# Patient Record
Sex: Female | Born: 2000 | Race: White | Hispanic: No | Marital: Single | Smoking: Never smoker
Health system: Southern US, Community
[De-identification: ages and names within clinical notes are randomized; demographics above are authoritative.]

---

## 2019-04-12 ENCOUNTER — Encounter: Payer: Self-pay | Admitting: Emergency Medicine

## 2019-04-12 ENCOUNTER — Other Ambulatory Visit: Payer: Self-pay

## 2019-04-12 DIAGNOSIS — Z5321 Procedure and treatment not carried out due to patient leaving prior to being seen by health care provider: Secondary | ICD-10-CM | POA: Diagnosis not present

## 2019-04-12 DIAGNOSIS — J029 Acute pharyngitis, unspecified: Secondary | ICD-10-CM | POA: Diagnosis not present

## 2019-04-12 LAB — GROUP A STREP BY PCR: Group A Strep by PCR: NOT DETECTED

## 2019-04-12 NOTE — ED Triage Notes (Signed)
Pt arrives ambulatory to triage with c/o sore throat since Thursday. Pt states that she was tested for strep and Covid with both tests negative on Friday. Pt is in NAD.

## 2019-04-13 ENCOUNTER — Emergency Department
Admission: EM | Admit: 2019-04-13 | Discharge: 2019-04-13 | Disposition: A | Discharge status: Home / Self Care | Payer: Managed Care, Other (non HMO) | Attending: Emergency Medicine | Admitting: Emergency Medicine

## 2019-04-13 NOTE — ED Notes (Signed)
Pt to stat desk stating that she feels like her throat is closing up. Pt's O2 checked by this RN and O2 is steady at 100% RA. Pt is in NAD.

## 2020-05-18 ENCOUNTER — Other Ambulatory Visit: Payer: Self-pay | Admitting: Family Medicine

## 2020-05-18 ENCOUNTER — Other Ambulatory Visit: Payer: Self-pay

## 2020-05-18 ENCOUNTER — Ambulatory Visit
Admission: RE | Admit: 2020-05-18 | Discharge: 2020-05-18 | Disposition: A | Discharge status: Home / Self Care | Payer: Managed Care, Other (non HMO) | Source: Ambulatory Visit | Attending: Family Medicine | Admitting: Family Medicine

## 2020-05-18 ENCOUNTER — Ambulatory Visit
Admission: RE | Admit: 2020-05-18 | Discharge: 2020-05-18 | Disposition: A | Discharge status: Home / Self Care | Payer: Managed Care, Other (non HMO) | Attending: Family Medicine | Admitting: Family Medicine

## 2020-05-18 DIAGNOSIS — R079 Chest pain, unspecified: Secondary | ICD-10-CM | POA: Diagnosis present

## 2020-05-18 DIAGNOSIS — R0602 Shortness of breath: Secondary | ICD-10-CM

## 2021-11-30 ENCOUNTER — Ambulatory Visit (INDEPENDENT_AMBULATORY_CARE_PROVIDER_SITE_OTHER): Payer: 59 | Admitting: Adult Health

## 2021-11-30 ENCOUNTER — Encounter: Payer: Self-pay | Admitting: Adult Health

## 2021-11-30 VITALS — BP 94/60 | HR 74 | Temp 96.3°F | Ht 67.0 in | Wt 138.0 lb

## 2021-11-30 DIAGNOSIS — L600 Ingrowing nail: Secondary | ICD-10-CM | POA: Diagnosis not present

## 2021-11-30 DIAGNOSIS — Z7184 Encounter for health counseling related to travel: Secondary | ICD-10-CM | POA: Diagnosis not present

## 2021-11-30 DIAGNOSIS — Z23 Encounter for immunization: Secondary | ICD-10-CM

## 2021-11-30 MED ORDER — ACETAZOLAMIDE 125 MG PO TABS: 125.0000 mg | ORAL_TABLET | Freq: Two times a day (BID) | ORAL | 0 refills | Status: AC

## 2021-11-30 NOTE — Progress Notes (Signed)
[facility] [place], [place] [address] Phone: [phone] Fax: [phone]   Office Visit Note  Patient Name: Jessica Gentry  Date of [id number]  Med Rec number [id number]  Date of Service: 11/30/2021  Patient has no known allergies.  Chief Complaint  Patient presents with   Needs RX   Ingrown toe nail    Left Great Toe     HPI Patient is here because she is going to [place] for 10 days and needs Diamox for altitude. She denies any other needs at this time. She is unsure if her vaccines are up to date.   Patient also has left great toe that she has been having ingrown toenail.  She has been trying to keep it lifted.  She has been soaking in epsom salt as well.  Just wants it looked at.   Also needs flu shot.   Current Medication:  No outpatient encounter medications on file as of 11/30/2021.   No facility-administered encounter medications on file as of 11/30/2021.      Medical History: History reviewed. No pertinent past medical history.   Vital Signs: BP 94/60   Pulse 74   Temp (!) 96.3 F (35.7 C) (Tympanic)   Ht 5\' 7"  (1.702 m)   Wt 138 lb (62.6 kg)   SpO2 98%   BMI 21.61 kg/m    Review of Systems  Constitutional:  Negative for chills, fatigue and fever.  Skin:        Left great toenail, short, appears mildly ingrown    Physical Exam Vitals and nursing note reviewed.  Constitutional:      Appearance: Normal appearance.  Skin:    Comments: Left great toe: Mildly ingrown lateral side of nail.  Neurological:     Mental Status: She is alert.    Assessment/Plan: 1. Encounter for counseling for travel Take diamox as discussed. Start on arrival in [place], and take until descending.  Obtain vaccine records, and message me to evaluate for any needs.  - acetaZOLAMIDE (DIAMOX) 125 MG tablet; Take 1 tablet (125 mg total) by mouth 2 (two) times daily.  Dispense: 20 tablet; Refill: 0  2. Ingrown toenail Continue to soak  with epsom salt.  No infection noted.  Discussed slowly lifting nail.  3. Flu vaccine need Flu vaccine given - Flu Vaccine QUAD High Dose(Fluad)        General Counseling: Jessica Gentry verbalizes understanding of the findings of todays visit and agrees with plan of treatment. I have discussed any further diagnostic evaluation that may be needed or ordered today. We also reviewed her medications today. she has been encouraged to call the office with any questions or concerns that should arise related to todays visit.   No orders of the defined types were placed in this encounter.   No orders of the defined types were placed in this encounter.   Time spent:20 Minutes Time spent includes review of chart, medications, test results, and follow up plan with the patient.    Kendell Bane AGNP-C Nurse Practitioner

## 2021-12-12 ENCOUNTER — Ambulatory Visit (INDEPENDENT_AMBULATORY_CARE_PROVIDER_SITE_OTHER): Payer: 59 | Admitting: Adult Health

## 2021-12-12 ENCOUNTER — Encounter: Payer: Self-pay | Admitting: Adult Health

## 2021-12-12 ENCOUNTER — Other Ambulatory Visit: Payer: Self-pay | Admitting: Adult Health

## 2021-12-12 VITALS — HR 108 | Temp 96.4°F

## 2021-12-12 DIAGNOSIS — Z23 Encounter for immunization: Secondary | ICD-10-CM

## 2021-12-12 DIAGNOSIS — Z7184 Encounter for health counseling related to travel: Secondary | ICD-10-CM | POA: Diagnosis not present

## 2021-12-12 DIAGNOSIS — Z2839 Other underimmunization status: Secondary | ICD-10-CM | POA: Diagnosis not present

## 2021-12-12 MED ORDER — TYPHOID VI POLYSACCHARIDE VACC 25 MCG/0.5ML IM SOSY: 0.5000 mL | PREFILLED_SYRINGE | Freq: Once | INTRAMUSCULAR | 0 refills | Status: AC

## 2021-12-12 MED ORDER — TYPHOID VI POLYSACCHARIDE VACC 25 MCG/0.5ML IM SOSY: 0.5000 mL | PREFILLED_SYRINGE | Freq: Once | INTRAMUSCULAR | 0 refills | Status: DC

## 2021-12-12 NOTE — Progress Notes (Signed)
[facility] [address] [place], [place] [address] Phone: [phone] Fax: [phone]   Office Visit Note  Patient Name: Jessica Gentry  Date of Birth:04-May-2000  Med Rec number [id number]  Date of Service: 12/12/2021  Patient has no known allergies.  Chief Complaint  Patient presents with   Vaccine needed     HPI  Patient is here for update on tetanus shot, as well as requesting typhoid vaccine for travel to [place].  She is leaving in 5 days.  We discussed effectiveness of vaccine given minimal time before travel.    Current Medication:  Outpatient Encounter Medications as of 12/12/2021  Medication Sig   typhoid polysaccharide Vacc (TYPHIM VI) 25 MCG/0.5ML injection Inject 0.5 mLs into the muscle once for 1 dose.   [DISCONTINUED] typhoid polysaccharide Vacc (TYPHIM VI) 25 MCG/0.5ML injection Inject 0.5 mLs into the muscle once for 1 dose.   acetaZOLAMIDE (DIAMOX) 125 MG tablet Take 1 tablet (125 mg total) by mouth 2 (two) times daily.   No facility-administered encounter medications on file as of 12/12/2021.      Medical History: History reviewed. No pertinent past medical history.   Vital Signs: Pulse (!) 108   Temp (!) 96.4 F (35.8 C) (Tympanic)   SpO2 98%    Review of Systems  Constitutional:  Negative for fatigue and fever.    Physical Exam Constitutional:      Appearance: Normal appearance.  Neurological:     Mental Status: She is alert.    Assessment/Plan: 1. Underimmunization status Given tetanus in office today.  - Tdap vaccine greater than or equal to 7yo IM  2. Travel advice encounter Up to date on childhood vaccines.  Tetanus updated today.  RX for Typhim sent to pharmacy.  - typhoid polysaccharide Vacc (TYPHIM VI) 25 MCG/0.5ML injection; Inject 0.5 mLs into the muscle once for 1 dose.  Dispense: 0.5 mL; Refill: 0        General Counseling: Makisha verbalizes understanding of the findings of todays visit and agrees with  plan of treatment. I have discussed any further diagnostic evaluation that may be needed or ordered today. We also reviewed her medications today. she has been encouraged to call the office with any questions or concerns that should arise related to todays visit.   Orders Placed This Encounter  Procedures   Tdap vaccine greater than or equal to 7yo IM    Meds ordered this encounter  Medications   DISCONTD: typhoid polysaccharide Vacc (TYPHIM VI) 25 MCG/0.5ML injection    Sig: Inject 0.5 mLs into the muscle once for 1 dose.    Dispense:  0.5 mL    Refill:  0   typhoid polysaccharide Vacc (TYPHIM VI) 25 MCG/0.5ML injection    Sig: Inject 0.5 mLs into the muscle once for 1 dose.    Dispense:  0.5 mL    Refill:  0    Time spent:15 Minutes Time spent includes review of chart, medications, test results, and follow up plan with the patient.    Johnna Acosta AGNP-C Nurse Practitioner

## 2022-02-17 ENCOUNTER — Ambulatory Visit: Payer: Self-pay

## 2022-02-17 ENCOUNTER — Ambulatory Visit: Admit: 2022-02-17 | Discharge status: Against Medical Advice | Payer: Self-pay

## 2022-08-11 IMAGING — CR DG CHEST 2V
1 series · 2 of 2 positions shown · non-contrast
Comparison: None.

CLINICAL DATA: Shortness of breath and chest pain for 3 weeks

EXAM:
CHEST - 2 VIEW

[Series 1: dg chest 2 view · 0.14mm/px · 2 of 2 slices shown]
[im 1/2]
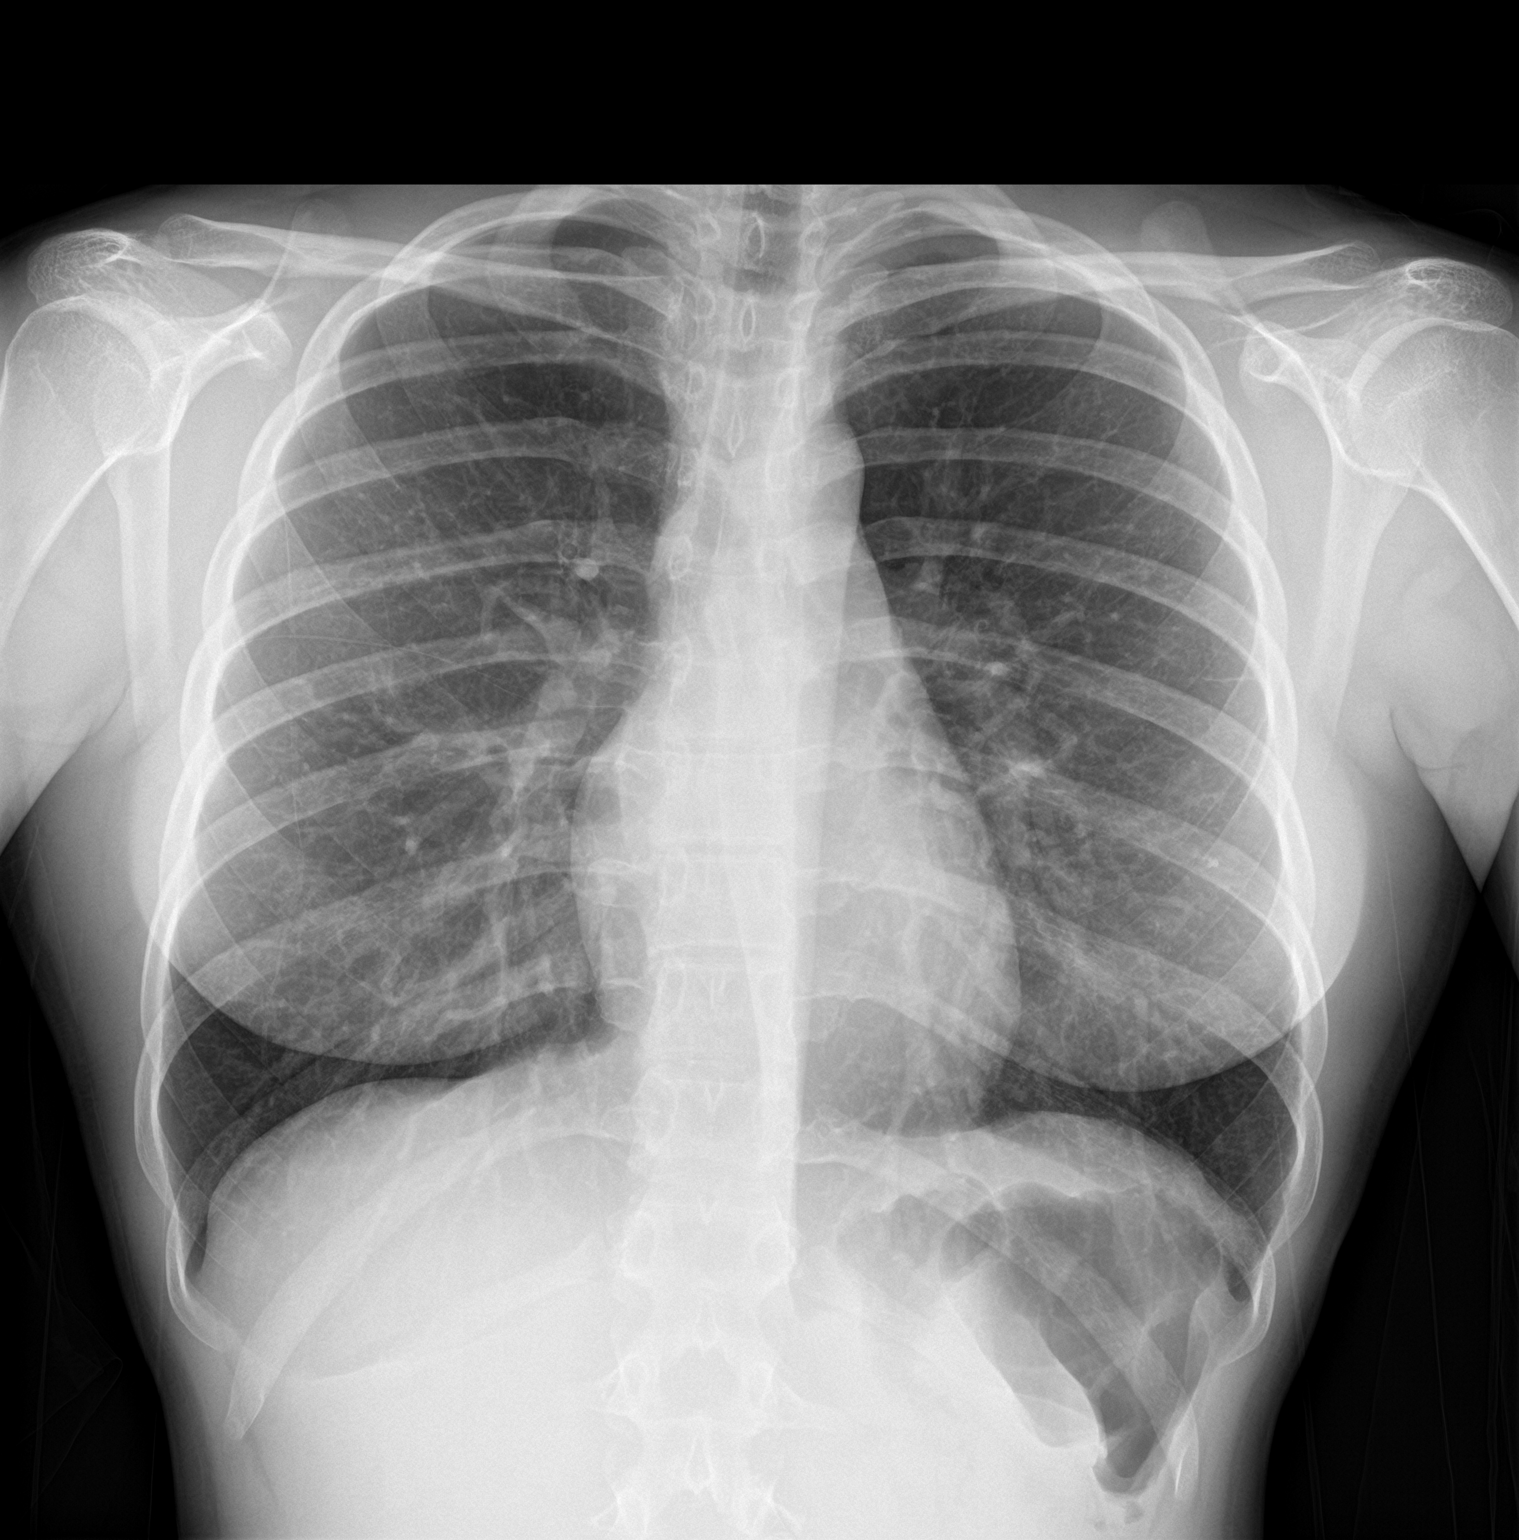
[im 2/2]
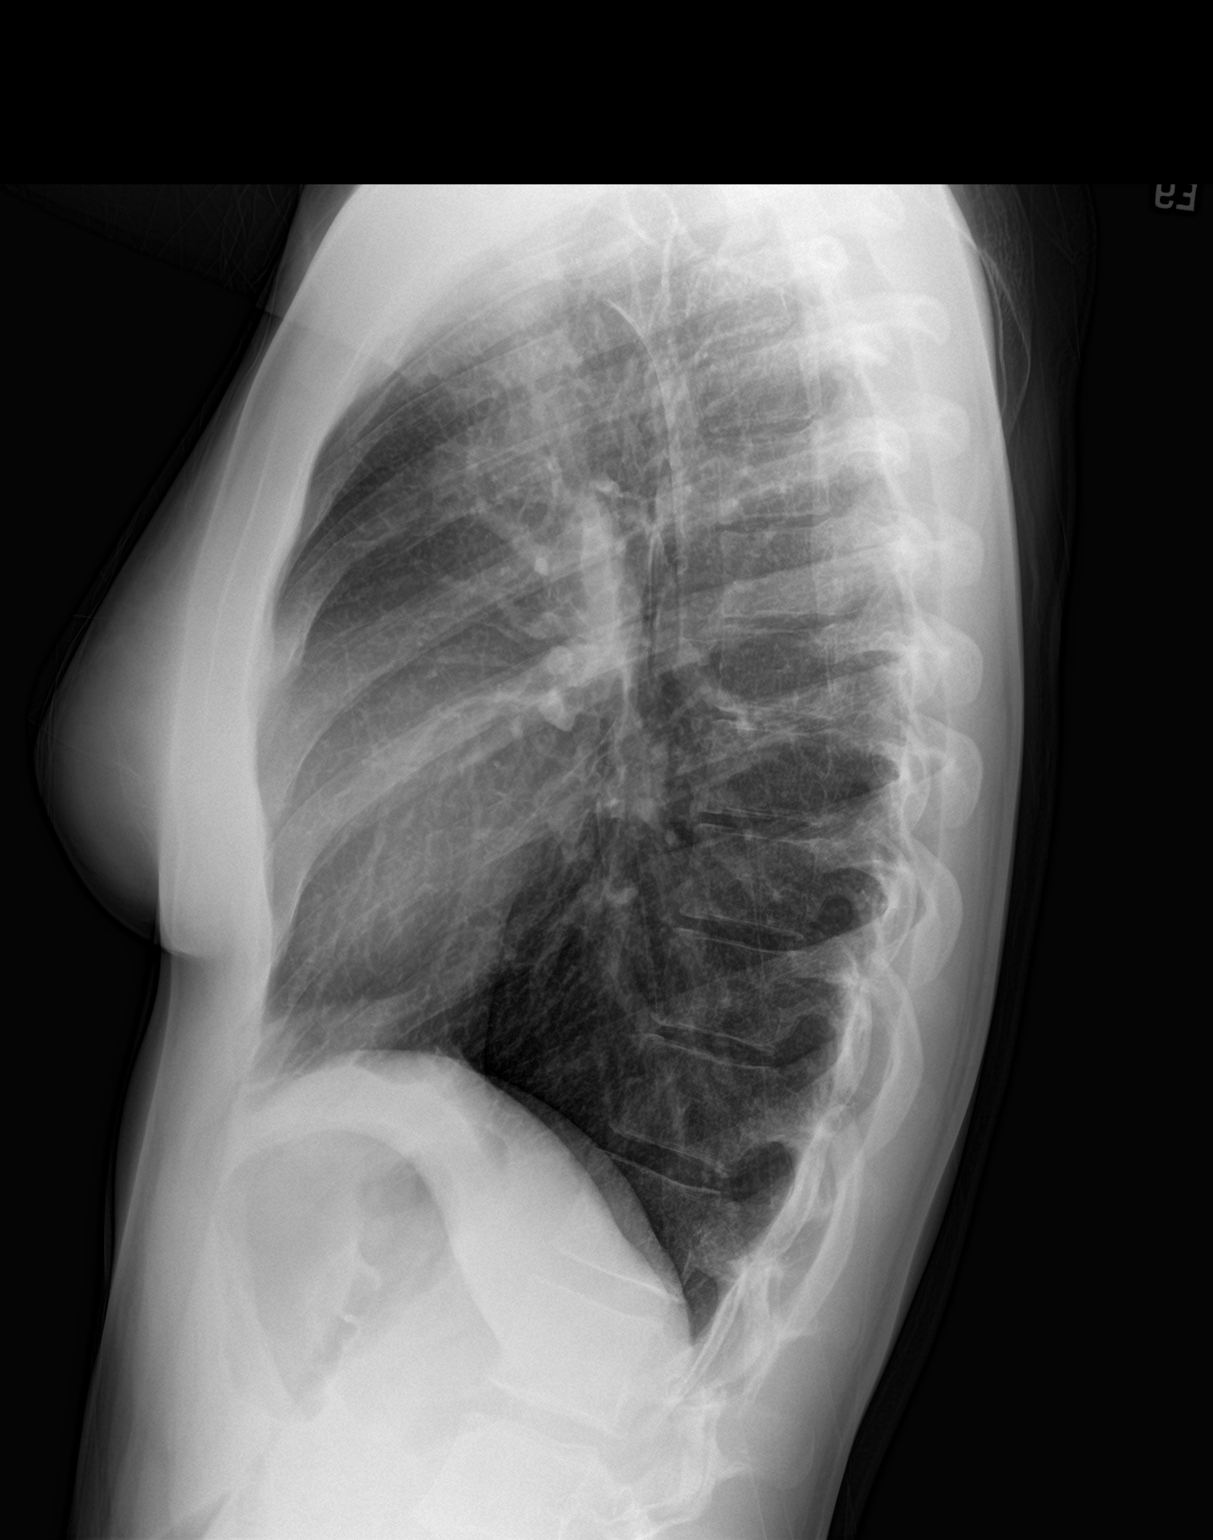

[2 of 2 positions shown; findings below may reference images not displayed]

FINDINGS: Heart size appears normal. No pleural effusion or edema. No airspace
consolidation or atelectasis. Visualized osseous structures are
unremarkable.
IMPRESSION: No active cardiopulmonary disease.
